# Patient Record
Sex: Male | Born: 1950 | Race: Black or African American | Hispanic: No | Marital: Married | Smoking: Never smoker
Health system: Southern US, Community
[De-identification: ages and names within clinical notes are randomized; demographics above are authoritative.]

## PROBLEM LIST (undated history)

## (undated) HISTORY — PX: OTHER SURGICAL HISTORY: SHX169

## (undated) HISTORY — PX: COLON SURGERY: SHX602

---

## 2010-01-27 ENCOUNTER — Inpatient Hospital Stay (HOSPITAL_COMMUNITY): Admission: EM | Admit: 2010-01-27 | Discharge: 2010-01-29 | Payer: Self-pay | Admitting: Emergency Medicine

## 2010-01-29 ENCOUNTER — Ambulatory Visit: Payer: Self-pay | Admitting: Psychiatry

## 2010-01-29 ENCOUNTER — Inpatient Hospital Stay (HOSPITAL_COMMUNITY): Admission: AD | Admit: 2010-01-29 | Discharge: 2010-01-30 | Payer: Self-pay | Admitting: Psychiatry

## 2010-02-12 ENCOUNTER — Ambulatory Visit: Payer: Self-pay | Admitting: Internal Medicine

## 2010-02-12 DIAGNOSIS — R079 Chest pain, unspecified: Secondary | ICD-10-CM

## 2010-02-12 DIAGNOSIS — J158 Pneumonia due to other specified bacteria: Secondary | ICD-10-CM | POA: Insufficient documentation

## 2010-03-07 ENCOUNTER — Inpatient Hospital Stay (HOSPITAL_COMMUNITY): Admission: EM | Admit: 2010-03-07 | Discharge: 2010-03-09 | Payer: Self-pay | Admitting: Emergency Medicine
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
## 2011-01-14 LAB — BASIC METABOLIC PANEL
BUN: 7 mg/dL (ref 6–23)
Calcium: 8.8 mg/dL (ref 8.4–10.5)
Chloride: 99 mEq/L (ref 96–112)
Creatinine, Ser: 0.77 mg/dL (ref 0.4–1.5)
Creatinine, Ser: 0.77 mg/dL (ref 0.4–1.5)
GFR calc Af Amer: >60 mL/min (ref >60)
GFR calc non Af Amer: >60 mL/min (ref >60)
Potassium: 3.7 mEq/L (ref 3.5–5.1)
Sodium: 135 mEq/L (ref 135–145)

## 2011-01-14 LAB — APTT: aPTT: 28 seconds (ref 24–37)

## 2011-01-14 LAB — CBC
HCT: 44.3 % (ref 39.0–52.0)
MCV: 85.8 fL (ref 78.0–100.0)
MCV: 85.9 fL (ref 78.0–100.0)
MCV: 85.9 fL (ref 78.0–100.0)
Platelets: 190 10*3/uL (ref 150–400)
Platelets: 194 10*3/uL (ref 150–400)
RBC: 4.77 MIL/uL (ref 4.22–5.81)
RBC: 5.15 MIL/uL (ref 4.22–5.81)
RDW: 11.9 % (ref 11.5–15.5)
WBC: 7 10*3/uL (ref 4.0–10.5)
WBC: 7.1 10*3/uL (ref 4.0–10.5)
WBC: 8.8 10*3/uL (ref 4.0–10.5)

## 2011-01-14 LAB — DIFFERENTIAL
Lymphocytes Relative: 7 % — ABNORMAL LOW (ref 12–46)
Lymphs Abs: 0.6 10*3/uL — ABNORMAL LOW (ref 0.7–4.0)
Monocytes Relative: 9 % (ref 3–12)
Neutro Abs: 7.3 10*3/uL (ref 1.7–7.7)
Neutrophils Relative %: 84 % — ABNORMAL HIGH (ref 43–77)

## 2011-01-14 LAB — TYPE AND SCREEN
ABO/RH(D): B POS
Antibody Screen: NEGATIVE

## 2011-01-14 LAB — POCT I-STAT, CHEM 8
Hemoglobin: 16.3 g/dL (ref 13.0–17.0)
Potassium: 3.8 mEq/L (ref 3.5–5.1)
Sodium: 139 mEq/L (ref 135–145)
TCO2: 26 mmol/L (ref 0–100)

## 2011-01-14 LAB — ABO/RH: ABO/RH(D): B POS

## 2011-01-15 LAB — CBC
HCT: 46.2 % (ref 39.0–52.0)
HCT: 49.5 % (ref 39.0–52.0)
Hemoglobin: 14.2 g/dL (ref 13.0–17.0)
Hemoglobin: 15.1 g/dL (ref 13.0–17.0)
MCHC: 33 g/dL (ref 30.0–36.0)
MCV: 88 fL (ref 78.0–100.0)
MCV: 88.4 fL (ref 78.0–100.0)
MCV: 88.6 fL (ref 78.0–100.0)
Platelets: 236 10*3/uL (ref 150–400)
RBC: 4.89 MIL/uL (ref 4.22–5.81)
RBC: 5.6 MIL/uL (ref 4.22–5.81)
RDW: 13 % (ref 11.5–15.5)
WBC: 5.8 10*3/uL (ref 4.0–10.5)
WBC: 8.2 10*3/uL (ref 4.0–10.5)
WBC: 9.4 10*3/uL (ref 4.0–10.5)

## 2011-01-15 LAB — URINALYSIS, ROUTINE W REFLEX MICROSCOPIC
Bilirubin Urine: NEGATIVE
Protein, ur: 30 mg/dL — AB
Urobilinogen, UA: 0.2 mg/dL (ref 0.0–1.0)

## 2011-01-15 LAB — DIFFERENTIAL
Basophils Absolute: 0 10*3/uL (ref 0.0–0.1)
Eosinophils Absolute: 0.2 10*3/uL (ref 0.0–0.7)
Eosinophils Relative: 2 % (ref 0–5)
Lymphocytes Relative: 9 % — ABNORMAL LOW (ref 12–46)
Lymphs Abs: 0.9 10*3/uL (ref 0.7–4.0)
Monocytes Absolute: 0.8 10*3/uL (ref 0.1–1.0)
Monocytes Relative: 6 % (ref 3–12)
Neutro Abs: 6.5 10*3/uL (ref 1.7–7.7)
Neutrophils Relative %: 80 % — ABNORMAL HIGH (ref 43–77)

## 2011-01-15 LAB — CULTURE, BLOOD (ROUTINE X 2): Culture: NO GROWTH

## 2011-01-15 LAB — MAGNESIUM: Magnesium: 1.9 mg/dL (ref 1.5–2.5)

## 2011-01-15 LAB — RAPID URINE DRUG SCREEN, HOSP PERFORMED
Amphetamines: NOT DETECTED
Barbiturates: NOT DETECTED
Benzodiazepines: NOT DETECTED

## 2011-01-15 LAB — EXPECTORATED SPUTUM ASSESSMENT W GRAM STAIN, RFLX TO RESP C

## 2011-01-15 LAB — BASIC METABOLIC PANEL
Chloride: 104 mEq/L (ref 96–112)
GFR calc Af Amer: >60 mL/min (ref >60)
Potassium: 3.6 mEq/L (ref 3.5–5.1)
Sodium: 139 mEq/L (ref 135–145)

## 2011-01-15 LAB — HIV ANTIBODY (ROUTINE TESTING W REFLEX): HIV: NONREACTIVE

## 2011-01-15 LAB — COMPREHENSIVE METABOLIC PANEL
Albumin: 3.3 g/dL — ABNORMAL LOW (ref 3.5–5.2)
BUN: 7 mg/dL (ref 6–23)
Chloride: 102 mEq/L (ref 96–112)
Creatinine, Ser: 0.89 mg/dL (ref 0.4–1.5)
Glucose, Bld: 105 mg/dL — ABNORMAL HIGH (ref 70–99)
Total Bilirubin: 0.9 mg/dL (ref 0.3–1.2)

## 2011-01-15 LAB — URINE MICROSCOPIC-ADD ON

## 2011-01-15 LAB — CULTURE, RESPIRATORY W GRAM STAIN: Culture: NORMAL

## 2011-01-15 LAB — HEPATITIS C ANTIBODY: HCV Ab: NEGATIVE

## 2011-01-15 LAB — ETHANOL: Alcohol, Ethyl (B): 108 mg/dL — ABNORMAL HIGH (ref 0–10)

## 2015-02-27 ENCOUNTER — Emergency Department (HOSPITAL_COMMUNITY): Payer: Self-pay

## 2015-02-27 ENCOUNTER — Encounter (HOSPITAL_COMMUNITY): Payer: Self-pay | Admitting: Emergency Medicine

## 2015-02-27 ENCOUNTER — Emergency Department (HOSPITAL_COMMUNITY)
Admission: EM | Admit: 2015-02-27 | Discharge: 2015-02-27 | Disposition: A | Discharge status: Home / Self Care | Payer: Self-pay | Attending: Emergency Medicine | Admitting: Emergency Medicine

## 2015-02-27 DIAGNOSIS — Y9302 Activity, running: Secondary | ICD-10-CM | POA: Insufficient documentation

## 2015-02-27 DIAGNOSIS — Y929 Unspecified place or not applicable: Secondary | ICD-10-CM | POA: Insufficient documentation

## 2015-02-27 DIAGNOSIS — Z23 Encounter for immunization: Secondary | ICD-10-CM | POA: Insufficient documentation

## 2015-02-27 DIAGNOSIS — S8991XA Unspecified injury of right lower leg, initial encounter: Secondary | ICD-10-CM | POA: Insufficient documentation

## 2015-02-27 DIAGNOSIS — Y998 Other external cause status: Secondary | ICD-10-CM | POA: Insufficient documentation

## 2015-02-27 DIAGNOSIS — Z880 Allergy status to penicillin: Secondary | ICD-10-CM | POA: Insufficient documentation

## 2015-02-27 DIAGNOSIS — W1839XA Other fall on same level, initial encounter: Secondary | ICD-10-CM | POA: Insufficient documentation

## 2015-02-27 DIAGNOSIS — M25561 Pain in right knee: Secondary | ICD-10-CM

## 2015-02-27 DIAGNOSIS — M25461 Effusion, right knee: Secondary | ICD-10-CM

## 2015-02-27 DIAGNOSIS — W19XXXA Unspecified fall, initial encounter: Secondary | ICD-10-CM

## 2015-02-27 MED ORDER — IBUPROFEN 600 MG PO TABS: 600.0000 mg | ORAL_TABLET | Freq: Four times a day (QID) | ORAL | Status: DC | PRN

## 2015-02-27 MED ORDER — HYDROCODONE-ACETAMINOPHEN 5-325 MG PO TABS: 1.0000 | ORAL_TABLET | Freq: Four times a day (QID) | ORAL | Status: AC | PRN

## 2015-02-27 MED ORDER — HYDROCODONE-ACETAMINOPHEN 5-325 MG PO TABS
1.0000 | ORAL_TABLET | Freq: Once | ORAL | Status: AC
  Administered 2015-02-27: 1 via ORAL
  Filled 2015-02-27: qty 1

## 2015-02-27 MED ORDER — TETANUS-DIPHTH-ACELL PERTUSSIS 5-2.5-18.5 LF-MCG/0.5 IM SUSP
0.5000 mL | Freq: Once | INTRAMUSCULAR | Status: AC
  Administered 2015-02-27: 0.5 mL via INTRAMUSCULAR
  Filled 2015-02-27: qty 0.5

## 2015-02-27 NOTE — Discharge Instructions (Signed)
Please follow up with your primary care physician in 1-2 days. If you do not have one please call the [facility] number listed above. Please follow up with Dr. Lajoyce Corners to schedule a follow up appointment.  Please take pain medication and/or muscle relaxants as prescribed and as needed for pain. Please do not drive on narcotic pain medication or on muscle relaxants. Please stay off your knee for 5-7 days and use the knee sleeve to help with swelling. Please read all discharge instructions and return precautions.   Knee Effusion The medical term for having fluid in your knee is effusion. This is often due to an internal derangement of the knee. This means something is wrong inside the knee. Some of the causes of fluid in the knee may be torn cartilage, a torn ligament, or bleeding into the joint from an injury. Your knee is likely more difficult to bend and move. This is often because there is increased pain and pressure in the joint. The time it takes for recovery from a knee effusion depends on different factors, including:   Type of injury.  Your age.  Physical and medical conditions.  Rehabilitation Strategies. How long you will be away from your normal activities will depend on what kind of knee problem you have and how much damage is present. Your knee has two types of cartilage. Articular cartilage covers the bone ends and lets your knee bend and move smoothly. Two menisci, thick pads of cartilage that form a rim inside the joint, help absorb shock and stabilize your knee. Ligaments bind the bones together and support your knee joint. Muscles move the joint, help support your knee, and take stress off the joint itself. CAUSES  Often an effusion in the knee is caused by an injury to one of the menisci. This is often a tear in the cartilage. Recovery after a meniscus injury depends on how much meniscus is damaged and whether you have damaged other knee tissue. Small tears may heal  on their own with conservative treatment. Conservative means rest, limited weight bearing activity and muscle strengthening exercises. Your recovery may take up to 6 weeks.  TREATMENT  Larger tears may require surgery. Meniscus injuries may be treated during arthroscopy. Arthroscopy is a procedure in which your surgeon uses a small telescope like instrument to look in your knee. Your caregiver can make a more accurate diagnosis (learning what is wrong) by performing an arthroscopic procedure. If your injury is on the inner margin of the meniscus, your surgeon may trim the meniscus back to a smooth rim. In other cases your surgeon will try to repair a damaged meniscus with stitches (sutures). This may make rehabilitation take longer, but may provide better long term result by helping your knee keep its shock absorption capabilities. Ligaments which are completely torn usually require surgery for repair. HOME CARE INSTRUCTIONS  Use crutches as instructed.  If a brace is applied, use as directed.  Once you are home, an ice pack applied to your swollen knee may help with discomfort and help decrease swelling.  Keep your knee raised (elevated) when you are not up and around or on crutches.  Only take over-the-counter or prescription medicines for pain, discomfort, or fever as directed by your caregiver.  Your caregivers will help with instructions for rehabilitation of your knee. This often includes strengthening exercises.  You may resume a normal diet and activities as directed. SEEK MEDICAL CARE IF:   There is increased  swelling in your knee.  You notice redness, swelling, or increasing pain in your knee.  An unexplained oral temperature above 102 F (38.9 C) develops. SEEK IMMEDIATE MEDICAL CARE IF:   You develop a rash.  You have difficulty breathing.  You have any allergic reactions from medications you may have been given.  There is severe pain with any motion of the knee. MAKE  SURE YOU:   Understand these instructions.  Will watch your condition.  Will get help right away if you are not doing well or get worse. Document Released: 01/03/2004 Document Revised: 01/05/2012 Document Reviewed: 03/08/2008 [facility] Patient Information 2015 [place], [place]. This information is not intended to replace advice given to you by your health care provider. Make sure you discuss any questions you have with your health care provider. Elastic Bandage and RICE Elastic bandages come in different shapes and sizes. They perform different functions. Your caregiver will help you to decide what is best for your protection, recovery, or rehabilitation following an injury. The following are some general tips to help you use an elastic bandage.  Use the bandage as directed by the maker of the bandage you are using.  Do not wrap it too tight. This may cut off the circulation of the arm or leg below the bandage.  If part of your body beyond the bandage becomes blue, numb, or swollen, it is too tight. Loosen the bandage as needed to prevent these problems.  See your caregiver or trainer if the bandage seems to be making your problems worse rather than better. Bandages may be a reminder to you that you have an injury. However, they provide very little support. The few pounds of support they provide are minor considering the pressure it takes to injure a joint or tear ligaments. Therefore, the joint will not be able to handle all of the wear and tear it could before the injury. The routine care of many injuries includes Rest, Ice, Compression, and Elevation (RICE).  Rest is required to allow your body to heal. Generally, routine activities can be resumed when comfortable. Injured tendons and bones take about 6 weeks to heal.  Icing the injury helps keep the swelling down and reduces pain. Do not apply ice directly to the skin. Put ice in a plastic bag. Place a towel between the skin and the bag.  This will prevent frostbite to the skin. Apply ice bags to the injured area for 15-20 minutes, every 2 hours while awake. Do this for the first 24 to 48 hours, then as directed by your caregiver.  Compression helps keep swelling down, gives support, and helps with discomfort. If an elastic bandage has been applied today, it should be removed and reapplied every 3 to 4 hours. It should not be applied tightly, but firmly enough to keep swelling down. Watch fingers or toes for swelling, bluish discoloration, coldness, numbness, or increased pain. If any of these problems occur, remove the bandage and reapply it more loosely. If these problems persist, contact your caregiver.  Elevation helps reduce swelling and decreases pain. The injured area (arms, hands, legs, or feet) should be placed near to or above the heart (center of the chest) if able. Persistent pain and inability to use the injured area for more than 2 to 3 days are warning signs. You should see a caregiver for a follow-up visit as soon as possible. Initially, a minor broken bone (hairline fracture) may not be seen on X-rays. It may take 7  to 10 days to finally show up. Continued pain and swelling show that further evaluation and/or X-rays are needed. Make a follow-up visit with your caregiver. A specialist in reading X-rays (radiologist) will read your X-rays again. Finding out the results of your test Not all test results are available during your visit. If your test results are not back during the visit, make an appointment with your caregiver to find out the results. Do not assume everything is normal if you have not heard from your caregiver or the medical facility. It is important for you to follow up on all of your test results. Document Released: 04/04/2002 Document Revised: 01/05/2012 Document Reviewed: 02/14/2008 [facility] Patient Information 2015 [place], [place]. This information is not intended to replace advice given to you by your  health care provider. Make sure you discuss any questions you have with your health care provider.
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
## 2015-02-27 NOTE — ED Notes (Signed)
Pt states he fell 2 days ago, injuring right knee. On presentation to ED today, pt had a tight dressing bound around right knee. Knee appears swollen, slightly red and warm to touch. Small abrasions  x 2 noted.

## 2015-08-27 ENCOUNTER — Emergency Department (HOSPITAL_COMMUNITY): Payer: BLUE CROSS/BLUE SHIELD

## 2015-08-27 ENCOUNTER — Observation Stay (HOSPITAL_COMMUNITY)
Admission: EM | Admit: 2015-08-27 | Discharge: 2015-08-28 | Disposition: A | Discharge status: Home / Self Care | Payer: BLUE CROSS/BLUE SHIELD | Attending: Family Medicine | Admitting: Family Medicine

## 2015-08-27 ENCOUNTER — Encounter (HOSPITAL_COMMUNITY): Payer: Self-pay | Admitting: Emergency Medicine

## 2015-08-27 DIAGNOSIS — M79601 Pain in right arm: Secondary | ICD-10-CM | POA: Insufficient documentation

## 2015-08-27 DIAGNOSIS — M542 Cervicalgia: Secondary | ICD-10-CM | POA: Insufficient documentation

## 2015-08-27 DIAGNOSIS — I160 Hypertensive urgency: Secondary | ICD-10-CM | POA: Diagnosis not present

## 2015-08-27 DIAGNOSIS — M549 Dorsalgia, unspecified: Secondary | ICD-10-CM | POA: Insufficient documentation

## 2015-08-27 DIAGNOSIS — R109 Unspecified abdominal pain: Secondary | ICD-10-CM

## 2015-08-27 DIAGNOSIS — M4806 Spinal stenosis, lumbar region: Secondary | ICD-10-CM | POA: Diagnosis not present

## 2015-08-27 DIAGNOSIS — R9431 Abnormal electrocardiogram [ECG] [EKG]: Secondary | ICD-10-CM | POA: Diagnosis not present

## 2015-08-27 DIAGNOSIS — M4317 Spondylolisthesis, lumbosacral region: Secondary | ICD-10-CM | POA: Insufficient documentation

## 2015-08-27 DIAGNOSIS — R200 Anesthesia of skin: Secondary | ICD-10-CM | POA: Diagnosis not present

## 2015-08-27 DIAGNOSIS — Z791 Long term (current) use of non-steroidal anti-inflammatories (NSAID): Secondary | ICD-10-CM | POA: Insufficient documentation

## 2015-08-27 DIAGNOSIS — Z79891 Long term (current) use of opiate analgesic: Secondary | ICD-10-CM | POA: Diagnosis not present

## 2015-08-27 DIAGNOSIS — R208 Other disturbances of skin sensation: Secondary | ICD-10-CM

## 2015-08-27 LAB — CBC
HCT: 45.8 % (ref 39.0–52.0)
Hemoglobin: 15 g/dL (ref 13.0–17.0)
MCH: 29.2 pg (ref 26.0–34.0)
MCHC: 32.8 g/dL (ref 30.0–36.0)
MCV: 89.1 fL (ref 78.0–100.0)
PLATELETS: 188 10*3/uL (ref 150–400)
RBC: 5.14 MIL/uL (ref 4.22–5.81)
RDW: 12.9 % (ref 11.5–15.5)
WBC: 6 10*3/uL (ref 4.0–10.5)

## 2015-08-27 LAB — APTT: aPTT: 33 seconds (ref 24–37)

## 2015-08-27 LAB — BASIC METABOLIC PANEL
Anion gap: 9 (ref 5–15)
BUN: 12 mg/dL (ref 6–20)
CALCIUM: 8.8 mg/dL — AB (ref 8.9–10.3)
CO2: 26 mmol/L (ref 22–32)
Chloride: 102 mmol/L (ref 101–111)
Creatinine, Ser: 0.93 mg/dL (ref 0.61–1.24)
GFR calc non Af Amer: >60 mL/min (ref >60)
GLUCOSE: 137 mg/dL — AB (ref 65–99)
Potassium: 4.1 mmol/L (ref 3.5–5.1)
Sodium: 137 mmol/L (ref 135–145)

## 2015-08-27 LAB — RAPID URINE DRUG SCREEN, HOSP PERFORMED
Amphetamines: NOT DETECTED
BARBITURATES: NOT DETECTED
Benzodiazepines: NOT DETECTED
Cocaine: NOT DETECTED
Opiates: NOT DETECTED
TETRAHYDROCANNABINOL: NOT DETECTED

## 2015-08-27 LAB — VITAMIN B12: Vitamin B-12: 344 pg/mL (ref 180–914)

## 2015-08-27 LAB — PROTIME-INR
INR: 1.14 (ref 0.00–1.49)
PROTHROMBIN TIME: 14.8 s (ref 11.6–15.2)

## 2015-08-27 LAB — TSH: TSH: 4.439 u[IU]/mL (ref 0.350–4.500)

## 2015-08-27 LAB — TROPONIN I

## 2015-08-27 LAB — I-STAT TROPONIN, ED: Troponin i, poc: 0 ng/mL (ref 0.00–0.08)

## 2015-08-27 MED ORDER — ACETAMINOPHEN 650 MG RE SUPP: 650.0000 mg | Freq: Four times a day (QID) | RECTAL | Status: DC | PRN

## 2015-08-27 MED ORDER — ALUM & MAG HYDROXIDE-SIMETH 200-200-20 MG/5ML PO SUSP: 30.0000 mL | Freq: Four times a day (QID) | ORAL | Status: DC | PRN

## 2015-08-27 MED ORDER — ASPIRIN 325 MG PO TABS
325.0000 mg | ORAL_TABLET | Freq: Every day | ORAL | Status: DC
  Administered 2015-08-27 – 2015-08-28 (×2): 325 mg via ORAL
  Filled 2015-08-27 (×2): qty 1

## 2015-08-27 MED ORDER — HEPARIN SODIUM (PORCINE) 5000 UNIT/ML IJ SOLN
5000.0000 [IU] | Freq: Three times a day (TID) | INTRAMUSCULAR | Status: DC
  Administered 2015-08-27: 5000 [IU] via SUBCUTANEOUS
  Filled 2015-08-27: qty 1

## 2015-08-27 MED ORDER — HYDRALAZINE HCL 20 MG/ML IJ SOLN
10.0000 mg | Freq: Once | INTRAMUSCULAR | Status: AC
  Administered 2015-08-27: 10 mg via INTRAVENOUS
  Filled 2015-08-27: qty 1

## 2015-08-27 MED ORDER — HYDRALAZINE HCL 20 MG/ML IJ SOLN: 5.0000 mg | INTRAMUSCULAR | Status: DC | PRN

## 2015-08-27 MED ORDER — NITROGLYCERIN 0.4 MG SL SUBL: 0.4000 mg | SUBLINGUAL_TABLET | SUBLINGUAL | Status: DC | PRN

## 2015-08-27 MED ORDER — ATORVASTATIN CALCIUM 40 MG PO TABS: 40.0000 mg | ORAL_TABLET | Freq: Every day | ORAL | Status: DC

## 2015-08-27 MED ORDER — HYDROCODONE-ACETAMINOPHEN 5-325 MG PO TABS: 1.0000 | ORAL_TABLET | Freq: Four times a day (QID) | ORAL | Status: DC | PRN

## 2015-08-27 MED ORDER — CARVEDILOL 3.125 MG PO TABS: 3.1250 mg | ORAL_TABLET | Freq: Two times a day (BID) | ORAL | Status: DC

## 2015-08-27 MED ORDER — IOHEXOL 350 MG/ML SOLN
100.0000 mL | Freq: Once | INTRAVENOUS | Status: AC | PRN
  Administered 2015-08-27: 100 mL via INTRAVENOUS

## 2015-08-27 MED ORDER — LABETALOL HCL 5 MG/ML IV SOLN
10.0000 mg | Freq: Once | INTRAVENOUS | Status: AC
  Administered 2015-08-27: 10 mg via INTRAVENOUS
  Filled 2015-08-27: qty 4

## 2015-08-27 MED ORDER — SODIUM CHLORIDE 0.9 % IV SOLN: INTRAVENOUS | Status: DC

## 2015-08-27 MED ORDER — MORPHINE SULFATE (PF) 4 MG/ML IV SOLN
4.0000 mg | INTRAVENOUS | Status: DC | PRN
  Administered 2015-08-28: 4 mg via INTRAVENOUS
  Filled 2015-08-27: qty 1

## 2015-08-27 MED ORDER — MORPHINE SULFATE (PF) 4 MG/ML IV SOLN
4.0000 mg | Freq: Once | INTRAVENOUS | Status: AC
  Administered 2015-08-27: 4 mg via INTRAVENOUS
  Filled 2015-08-27: qty 1

## 2015-08-27 MED ORDER — AMLODIPINE BESYLATE 10 MG PO TABS
10.0000 mg | ORAL_TABLET | Freq: Every day | ORAL | Status: DC
  Administered 2015-08-27 – 2015-08-28 (×2): 10 mg via ORAL
  Filled 2015-08-27: qty 2
  Filled 2015-08-27: qty 1

## 2015-08-27 MED ORDER — SODIUM CHLORIDE 0.9 % IJ SOLN
3.0000 mL | Freq: Two times a day (BID) | INTRAMUSCULAR | Status: DC
  Administered 2015-08-28: 3 mL via INTRAVENOUS

## 2015-08-27 MED ORDER — ACETAMINOPHEN 325 MG PO TABS: 650.0000 mg | ORAL_TABLET | Freq: Four times a day (QID) | ORAL | Status: DC | PRN

## 2015-08-27 NOTE — ED Notes (Signed)
Pt reports Saturday he started to have pain in R side of neck. Since then the pain has progressively radiated down R arm. Reports numbness to R thumb. Grip strengths equal. denies cp.

## 2015-08-27 NOTE — ED Notes (Signed)
Dr Thurnell LoseYow in room and asked for new EKG. New EKG shot by this RN. Pt has PAC's.
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
## 2015-08-28 ENCOUNTER — Inpatient Hospital Stay (HOSPITAL_COMMUNITY): Payer: BLUE CROSS/BLUE SHIELD

## 2015-08-28 DIAGNOSIS — I160 Hypertensive urgency: Secondary | ICD-10-CM

## 2015-08-28 DIAGNOSIS — M542 Cervicalgia: Secondary | ICD-10-CM | POA: Diagnosis not present

## 2015-08-28 DIAGNOSIS — M79601 Pain in right arm: Secondary | ICD-10-CM | POA: Diagnosis not present

## 2015-08-28 DIAGNOSIS — M549 Dorsalgia, unspecified: Secondary | ICD-10-CM | POA: Diagnosis not present

## 2015-08-28 DIAGNOSIS — R9431 Abnormal electrocardiogram [ECG] [EKG]: Secondary | ICD-10-CM

## 2015-08-28 LAB — CBC
HEMATOCRIT: 44.1 % (ref 39.0–52.0)
HEMOGLOBIN: 14.3 g/dL (ref 13.0–17.0)
MCH: 28.7 pg (ref 26.0–34.0)
MCHC: 32.4 g/dL (ref 30.0–36.0)
MCV: 88.4 fL (ref 78.0–100.0)
PLATELETS: 189 10*3/uL (ref 150–400)
RBC: 4.99 MIL/uL (ref 4.22–5.81)
RDW: 12.9 % (ref 11.5–15.5)
WBC: 6 10*3/uL (ref 4.0–10.5)

## 2015-08-28 LAB — COMPREHENSIVE METABOLIC PANEL
ALT: 28 U/L (ref 17–63)
ANION GAP: 6 (ref 5–15)
AST: 31 U/L (ref 15–41)
Albumin: 3.5 g/dL (ref 3.5–5.0)
Alkaline Phosphatase: 53 U/L (ref 38–126)
BUN: 9 mg/dL (ref 6–20)
CHLORIDE: 105 mmol/L (ref 101–111)
CO2: 27 mmol/L (ref 22–32)
CREATININE: 0.75 mg/dL (ref 0.61–1.24)
Calcium: 8.8 mg/dL — ABNORMAL LOW (ref 8.9–10.3)
Glucose, Bld: 112 mg/dL — ABNORMAL HIGH (ref 65–99)
POTASSIUM: 3.6 mmol/L (ref 3.5–5.1)
SODIUM: 138 mmol/L (ref 135–145)
Total Bilirubin: 1 mg/dL (ref 0.3–1.2)
Total Protein: 7.3 g/dL (ref 6.5–8.1)

## 2015-08-28 LAB — TROPONIN I

## 2015-08-28 LAB — LIPID PANEL
CHOL/HDL RATIO: 4.1 ratio
Cholesterol: 164 mg/dL (ref 0–200)
HDL: 40 mg/dL — AB (ref >40)
LDL Cholesterol: 110 mg/dL — ABNORMAL HIGH (ref 0–99)
Triglycerides: 71 mg/dL (ref <150)
VLDL: 14 mg/dL (ref 0–40)

## 2015-08-28 LAB — GLUCOSE, CAPILLARY: GLUCOSE-CAPILLARY: 91 mg/dL (ref 65–99)

## 2015-08-28 MED ORDER — AMLODIPINE BESYLATE 10 MG PO TABS: 10.0000 mg | ORAL_TABLET | Freq: Every day | ORAL | Status: AC

## 2015-08-28 MED ORDER — ATORVASTATIN CALCIUM 40 MG PO TABS: 40.0000 mg | ORAL_TABLET | Freq: Every day | ORAL | Status: AC

## 2015-08-28 MED ORDER — ASPIRIN 81 MG PO TABS: 81.0000 mg | ORAL_TABLET | Freq: Every day | ORAL | Status: AC

## 2015-08-28 NOTE — Progress Notes (Signed)
Discharge teaching and instructions reviewed with pt.VSS Pt discharging home with wife.

## 2015-08-28 NOTE — Care Management Note (Addendum)
Case Management Note  Patient Details  Name: Joseph Sanchez MRN: [id number] Date of Birth: 1951/10/20  Subjective/Objective:  Pt admitted for hypertension urgency.                   Action/Plan: CM will continue to monitor. Pt is without PCP and insurance. Will f/u.    Expected Discharge Date:                  Expected Discharge Plan:  Home/Self Care  In-House Referral:     Discharge planning Services  CM Consult  Post Acute Care Choice:    Choice offered to:     DME Arranged:    DME Agency:     HH Arranged:    HH Agency:     Status of Service:  In process, will continue to follow  Medicare Important Message Given:    Date Medicare IM Given:    Medicare IM give by:    Date Additional Medicare IM Given:    Additional Medicare Important Message give by:     If discussed at Long Length of Stay Meetings, dates discussed:    Additional Comments: 1350 08-28-15 Joseph BambergerBrenda Graves-Bigelow, RN,BSN [phone] CM was able to speak with pt in regards to PCP before d/c. [facility] Pt will make hospital f/u. No further needs from CM at this time.   Joseph Sanchez, Joseph Harju Kaye, RN 08/28/2015, 12:39 PM

## 2015-08-28 NOTE — Discharge Summary (Signed)
Physician Discharge Summary  Joseph Sanchez ZOX:[id number] DOB: 07-Jun-1951 DOA: 08/27/2015  PCP: No PCP Per Patient  Admit date: 08/27/2015 Discharge date: 08/28/2015  Time spent: > 30 minutes  Recommendations for Outpatient Follow-up:  1. Please monitor blood pressures and adjust antihypertensive regimen accordingly  Discharge Diagnoses:  Principal Problem:   Hypertensive urgency Active Problems:   Abnormal EKG   Neck pain   Discharge Condition: stable  Diet recommendation: heart healthy/low sodium diet  Filed Weights   08/27/15 1750 08/27/15 2214  Weight: 92.897 kg (204 lb 12.8 oz) 90.992 kg (200 lb 9.6 oz)    History of present illness:  There is a 64 year old who presented complaining of numbness in the right, neck pain. He denies any chest pain and was initially found to have elevated blood pressures a 191/94 reportedly in the ED  Hospital Course:  Hypertension - Was initially uncontrolled but improved controlled on amlodipine. Will provide prescription on discharge. Will also recommend low sodium diet.  Right thumb numbness - MRI of brain reported no acute intracranial infarct -MRI of cervical spine reported severe right foraminal narrowing at C5-C6. Case discussed with neurosurgery who recommends NSAIDs, opioids, and follow-up with neurosurgery in 3-4 weeks - Patient denies any pain in the thumb or numbness on day of discharge  Inverted T waves in EKG - Echocardiogram reported no regional wall motion abnormalities and normal EF of 65-70%. He denies any chest pain and does not endorse any chest pain to me since visit to the hospital. -Troponin negative 3 - Patient to follow-up with cardiologist. Case discussed with cardiologist over the phone and patient okay to follow-up as outpatient since he is having no chest discomfort  Procedures:  None  Consultations:  Cardiology  Neurosurgery  Discharge Exam: Filed Vitals:   08/28/15 0924  BP: 155/85  Pulse: 69   Temp: 99.1 F (37.3 C)  Resp:     General: Patient in no acute distress, alert and awake Cardiovascular: Regular rate and rhythm, no rubs Respiratory: No increased work of breathing, no wheezes, equal chest rise  Discharge Instructions   Discharge Instructions    Call MD for:  temperature >100.4    Complete by:  As directed      Diet - low sodium heart healthy    Complete by:  As directed      Discharge instructions    Complete by:  As directed   Please follow up with the Cardiologist for further evaluation and recommendations.  Also follow up with the neurosurgeon     Increase activity slowly    Complete by:  As directed           Current Discharge Medication List    START taking these medications   Details  amLODipine (NORVASC) 10 MG tablet Take 1 tablet (10 mg total) by mouth daily. Qty: 30 tablet, Refills: 0    aspirin 81 MG tablet Take 1 tablet (81 mg total) by mouth daily. Qty: 30 tablet, Refills: 0    atorvastatin (LIPITOR) 40 MG tablet Take 1 tablet (40 mg total) by mouth daily at 6 PM. Qty: 30 tablet, Refills: 0      CONTINUE these medications which have NOT CHANGED   Details  ibuprofen (ADVIL,MOTRIN) 200 MG tablet Take 400 mg by mouth every 6 (six) hours as needed for moderate pain.    HYDROcodone-acetaminophen (NORCO/VICODIN) 5-325 MG per tablet Take 1-2 tablets by mouth every 6 (six) hours as needed for severe pain. Qty: 15 tablet, Refills: 0  Allergies  Allergen Reactions  . Penicillins Swelling and Other (See Comments)    Lip swelling and peeling  . Tetracyclines & Related Swelling and Other (See Comments)    LIP SWELLING AND PEELING   Follow-up Information    Follow up with NUNDKUMAR, NEELESH, C, MD In 4 weeks.   Specialty:  Neurosurgery   Contact information:   [address] [place] [address] [phone]        The results of significant diagnostics from this hospitalization (including imaging,  microbiology, ancillary and laboratory) are listed below for reference.    Significant Diagnostic Studies: Dg Chest 2 View  08/27/2015  CLINICAL DATA:  Chest pain, right neck pain, right arm pain and right back pain, beginning 2 days ago. Right thumb numbness. EXAM: CHEST  2 VIEW COMPARISON:  03/07/2010. FINDINGS: Normal sized heart. The previously seen bilateral airspace opacities have resolved. Small amount of linear density in those regions today. Mildly prominent interstitial markings with flattening of the hemidiaphragms. Thoracic spine degenerative changes, including changes of DISH. Mild bilateral shoulder degenerative changes. IMPRESSION: 1. No acute abnormality. 2. Mild changes of COPD. 3. Mild bilateral scarring. Electronically Signed   By: Beckie Salts M.D.   On: 08/27/2015 18:56   Mr Brain Wo Contrast  08/28/2015  CLINICAL DATA:  Initial evaluation for right-sided facial and neck pain. EXAM: MRI HEAD WITHOUT CONTRAST TECHNIQUE: Multiplanar, multiecho pulse sequences of the brain and surrounding structures were obtained without intravenous contrast. COMPARISON:  None. FINDINGS: Mild age-related cerebral atrophy present. Patchy T2/FLAIR hyperintensity within the periventricular and deep white matter both cerebral hemispheres most likely related to chronic small vessel ischemic disease, moderate nature. Small remote lacunar infarct within the anterior aspect of the left centrum semi ovale. No other areas of chronic infarction. No abnormal foci of restricted diffusion to suggest acute intracranial infarct. Gray-white matter differentiation is maintained. Normal intravascular flow voids are preserved. No acute or chronic intracranial hemorrhage. No mass lesion, midline shift, or mass effect. No hydrocephalus. No extra-axial fluid collection. Craniocervical junction within normal limits. Pituitary gland normal.  No acute abnormality about the orbits. Scattered mucosal thickening within the ethmoidal  air cells. Paranasal sinuses are otherwise clear. No mastoid effusion. Inner ear structures grossly unremarkable. Bone marrow signal intensity within normal limits. No acute scalp soft tissue abnormality. IMPRESSION: 1. No acute intracranial infarct or other process identified. 2. Mild age-related cerebral atrophy with moderate chronic small vessel ischemic disease. Electronically Signed   By: Rise Mu M.D.   On: 08/28/2015 04:47   Mr Cervical Spine Wo Contrast  08/28/2015  CLINICAL DATA:  Initial evaluation for acute right-sided facial pain and neck pain radiating into right upper extremity. EXAM: MRI CERVICAL SPINE WITHOUT CONTRAST TECHNIQUE: Multiplanar, multisequence MR imaging of the cervical spine was performed. No intravenous contrast was administered. COMPARISON:  None. FINDINGS: Visualized portions of the brain and posterior fossa demonstrate a normal appearance with normal signal intensity. The craniocervical junction is widely patent. Minimal trace anterolisthesis of C7 on T1. Vertebral bodies are otherwise normally aligned with preservation of the normal cervical lordosis paravertebral body heights are well preserved. No fracture or listhesis. Signal intensity within the vertebral body bone marrow is normal. No focal osseous lesions. No marrow edema. Signal intensity within the cervical spinal cord is normal. Incidental note made of a a well-circumscribed T2 hyperintense ovoid 14 mm lesion within the skin of the right face (series 17, image 10), indeterminate, but of doubtful clinical significance. Visualized  soft tissues otherwise unremarkable. No prevertebral edema. Apparent susceptibility artifact partially visualized within the left upper neck seen on axial sequence favored to be artifactual in nature (series 16, image 1). C2-C3: Minimal bilateral uncovertebral hypertrophy without significant stenosis. C3-C4: Bilateral uncovertebral hypertrophy with mild right-sided facet arthrosis.  There is a superimposed more focal right foraminal disc osteophyte complex resulting an moderate to severe right foraminal stenosis. Left-sided uncovertebral spurring and facet arthrosis results in mild to moderate left foraminal narrowing as well. Minimal flattening of the posterior thecal sac without significant canal stenosis. C4-C5: Bilateral uncovertebral hypertrophy with mild facet arthrosis. Superimposed right foraminal disc osteophyte complex results in fairly severe right foraminal stenosis. Mild to moderate left foraminal narrowing related uncovertebral hypertrophy and facet disease. No significant canal stenosis. C5-C6: Bilateral uncovertebral hypertrophy with mild bilateral facet arthrosis. Superimposed more focal broad-based right foraminal disc osteophyte complex with resultant moderate to severe right foraminal stenosis. Degenerative changes results in mild moderate left foraminal narrowing as well. Posterior disc osteophyte minimally indents and effaces the ventral thecal sac with resultant very mild canal stenosis. Superimposed mild ligamentum flavum hypertrophy. C6-C7: Mild bilateral uncovertebral hypertrophy and facet arthrosis. Broad-based annular disc bulge. Resultant mild to moderate bilateral foraminal stenosis. No significant canal narrowing. C7-T1:  Trace anterolisthesis C7 on T1.  No significant stenosis. Visualized upper thoracic spine demonstrates no acute abnormality or stenosis. IMPRESSION: 1. Right foraminal degenerative disc osteophyte complexes at C3-4, C4-5, and C5-6 with superimposed facet disease, resulting in moderate to severe right foraminal narrowing at these levels. Changes could certainly result in patient's right-sided symptoms. 2. Additional more mild multilevel degenerative spondylolysis as detailed above. Mild canal narrowing at C5-6. No other significant canal stenosis within the cervical spine. Electronically Signed   By: Rise MuBenjamin  McClintock M.D.   On: 08/28/2015  05:05   Ct Angio Chest Aorta W/cm &/or Wo/cm  08/27/2015  CLINICAL DATA:  Chest and upper abdominal pain radiating to the back, asymmetric to right accompanied by shortness of breath starting 2 days ago. EXAM: CT ANGIOGRAPHY CHEST AND ABDOMEN TECHNIQUE: Multidetector CT imaging of the chest and abdomen was performed using the standard protocol during bolus administration of intravenous contrast. Multiplanar CT image reconstructions and MIPs were obtained to evaluate the vascular anatomy. CONTRAST:  100mL OMNIPAQUE IOHEXOL 350 MG/ML SOLN COMPARISON:  CT scan of the chest dated 01/27/2010 and chest x-ray dated 08/27/2015 FINDINGS: CTA CHEST FINDINGS There is no evidence of aortic dissection. Heart size is normal. No coronary artery calcification. Minimal calcification in the arch of the aorta. Origins of the brachiocephalic vessels are normal. There are no acute pulmonary infiltrates or effusions. No adenopathy. There is a stable 2.7 cm bleb in the left lower lobe. There is peripheral scarring at both lung bases and laterally in the mid zones with multiple tiny pleural-based nodules bilaterally which are unchanged since the prior exam. No pulmonary emboli. No significant osseous abnormality. Degenerative osteophytes throughout the thoracic spine. Review of the MIP images confirms the above findings. CTA ABDOMEN FINDINGS The abdominal aorta is normal. Widely patent SMA, IMA and celiac arteries. Widely patent renal arteries, 2 on the left and 1 on the right. Minimal calcification in the distal abdominal aorta. Liver, spleen, adrenal glands and kidneys are normal except for a stable 14 mm cyst in the lower pole the right kidney. Pancreas is normal. Chronic slight prominence of the pancreatic duct is not felt to be significant. Biliary tree is normal. The bowel appears normal including the terminal ileum and appendix.  Bladder and prostate gland are normal. No adenopathy. Severe facet arthritis at L5-S1 with grade 1  spondylolisthesis. Prominent soft disc protrusions into both neural foramina which could affect either or both L5 nerves. Moderate bilateral foraminal stenosis at L4-5. Review of the MIP images confirms the above findings. IMPRESSION: 1. No evidence of aortic dissection or other acute abnormality in the chest. Chronic peripheral scarring in both lungs. 2. No acute abnormality of the abdomen or pelvis. Electronically Signed   By: Francene Boyers M.D.   On: 08/27/2015 20:09   Ct Angio Abd/pel W/ And/or W/o  08/27/2015  CLINICAL DATA:  Chest and upper abdominal pain radiating to the back, asymmetric to right accompanied by shortness of breath starting 2 days ago. EXAM: CT ANGIOGRAPHY CHEST AND ABDOMEN TECHNIQUE: Multidetector CT imaging of the chest and abdomen was performed using the standard protocol during bolus administration of intravenous contrast. Multiplanar CT image reconstructions and MIPs were obtained to evaluate the vascular anatomy. CONTRAST:  OMNIPAQUE IOHEXOL 350 MG/ML SOLN COMPARISON:  CT scan of the chest dated 01/27/2010 and chest x-ray dated 08/27/2015 FINDINGS: CTA CHEST FINDINGS There is no evidence of aortic dissection. Heart size is normal. No coronary artery calcification. Minimal calcification in the arch of the aorta. Origins of the brachiocephalic vessels are normal. There are no acute pulmonary infiltrates or effusions. No adenopathy. There is a stable 2.7 cm bleb in the left lower lobe. There is peripheral scarring at both lung bases and laterally in the mid zones with multiple tiny pleural-based nodules bilaterally which are unchanged since the prior exam. No pulmonary emboli. No significant osseous abnormality. Degenerative osteophytes throughout the thoracic spine. Review of the MIP images confirms the above findings. CTA ABDOMEN FINDINGS The abdominal aorta is normal. Widely patent SMA, IMA and celiac arteries. Widely patent renal arteries, 2 on the left and 1 on the right.  Minimal calcification in the distal abdominal aorta. Liver, spleen, adrenal glands and kidneys are normal except for a stable 14 mm cyst in the lower pole the right kidney. Pancreas is normal. Chronic slight prominence of the pancreatic duct is not felt to be significant. Biliary tree is normal. The bowel appears normal including the terminal ileum and appendix. Bladder and prostate gland are normal. No adenopathy. 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 Prominent soft disc protrusions into both neural foramina which could affect either or both L5 nerves. Moderate bilateral foraminal stenosis at L4-5. Review of the MIP images confirms the above findings. IMPRESSION: 1. No evidence of aortic dissection or other acute abnormality in the chest. Chronic peripheral scarring in both lungs. 2. No acute abnormality of the abdomen or pelvis. Electronically Signed   By: Francene Boyers M.D.   On: 08/27/2015 20:09    Microbiology: No results found for this or any previous visit (from the past 240 hour(s)).   Labs: Basic Metabolic Panel:  Recent Labs Lab 08/27/15 1809 08/28/15 0406  NA 137 138  K 4.1 3.6  CL 102 105  CO2 26 27  GLUCOSE 137* 112*  BUN 12 9  CREATININE 0.93 0.75  CALCIUM 8.8* 8.8*   Liver Function Tests:  Recent Labs Lab 08/28/15 0406  AST 31  ALT 28  ALKPHOS 53  BILITOT 1.0  PROT 7.3  ALBUMIN 3.5   No results for input(s): LIPASE, AMYLASE in the last 168 hours. No results for input(s): AMMONIA in the last 168 hours. CBC:  Recent Labs Lab 08/27/15 1809 08/28/15 0406  WBC  6.0 6.0  HGB 15.0 14.3  HCT 45.8 44.1  MCV 89.1 88.4  PLT 188 189   Cardiac Enzymes:  Recent Labs Lab 08/27/15 2209 08/28/15 0406 08/28/15 0957  TROPONINI <0.03 <0.03 <0.03   BNP: BNP (last 3 results) No results for input(s): BNP in the last 8760 hours.  ProBNP (last 3 results) No results for input(s): PROBNP in the last 8760 hours.  CBG:  Recent Labs Lab  08/28/15 0739  GLUCAP 91       Signed:  Penny Pia  [facility] 08/28/2015, 1:13 PM

## 2015-08-28 NOTE — Progress Notes (Signed)
I have reviewed the patient's history and MRI Cspine. He has had acute onset of right-sided neck pain. MRI demonstrates mod-severe multilevel foraminal stenosis from C3-4 to C5-6, without central stenosis or spinal cord compression. I would initially manage symptoms medically with anti-inflammatories, perhaps PRN opiates. I can see him in the office in 4-6 weeks for follow-up at which time we can assess the need for more invasive management options (ESI or surgery).

## 2015-08-28 NOTE — Progress Notes (Signed)
  Echocardiogram 2D Echocardiogram has been performed.  Joseph BasemanReel, [name] M 08/28/2015, 9:36 AM
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
## 2015-08-28 NOTE — Progress Notes (Signed)
UR Completed Kraven Calk Graves-Bigelow, RN,BSN [phone]  

## 2015-08-29 LAB — HEMOGLOBIN A1C
Hgb A1c MFr Bld: 5.3 % (ref 4.8–5.6)
MEAN PLASMA GLUCOSE: 105 mg/dL

## 2016-07-27 IMAGING — MR MR HEAD W/O CM
8 of 10 series · 35 of 48 positions shown · non-contrast
Comparison: None.

CLINICAL DATA: Initial evaluation for right-sided facial and neck
pain.

EXAM:
MRI HEAD WITHOUT CONTRAST
TECHNIQUE: Multiplanar, multiecho pulse sequences of the brain and surrounding
structures were obtained without intravenous contrast.

[Series 3: DWI · axial · 3.0mm · 1.09mm/px · z∈[-95,+27]mm · 8 of 84 slices shown (1 of 4)]
[im 1/84]
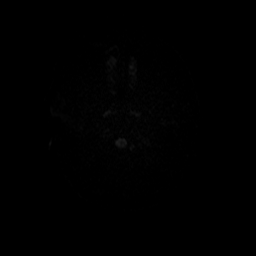
[im 12/84]
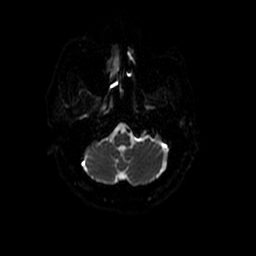
[im 24/84]
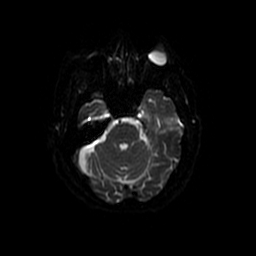
[im 36/84]
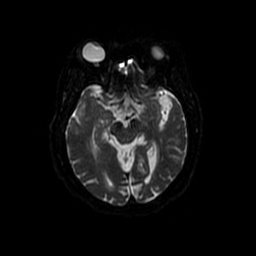
[im 48/84]
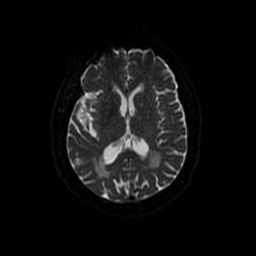
[im 60/84]
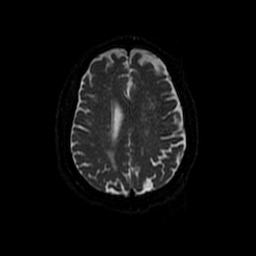
[im 72/84]
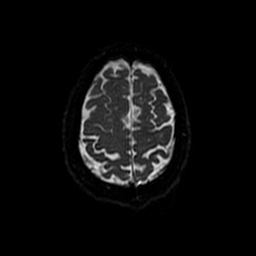
[im 84/84]
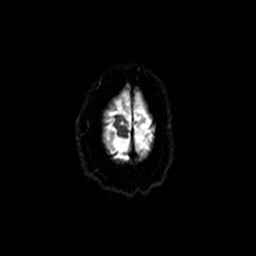

[Series 4: T1 · sagittal · 5.0mm · 0.47mm/px · 2 of 24 slices shown]
[im 1/24]
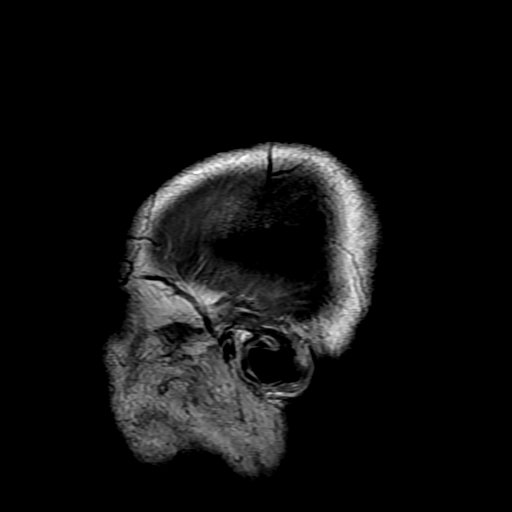
[im 24/24]
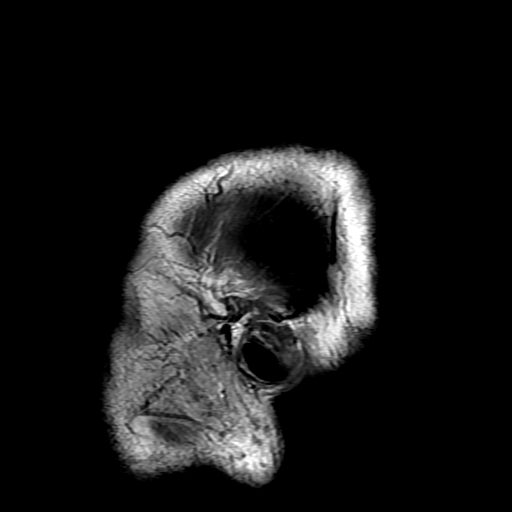

[Series 5: DWI · coronal · 5.0mm · 1.09mm/px · 7 of 66 slices shown (2 of 4)]
[im 1/66]
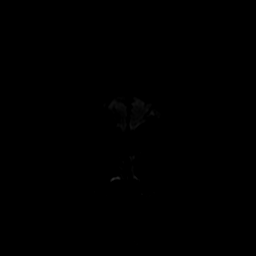
[im 11/66]
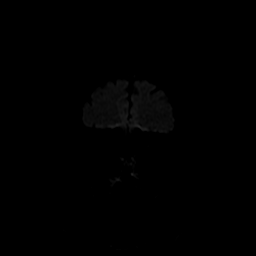
[im 22/66]
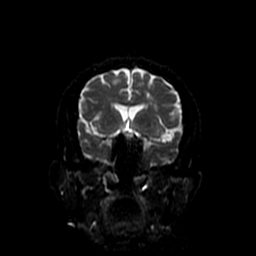
[im 33/66]
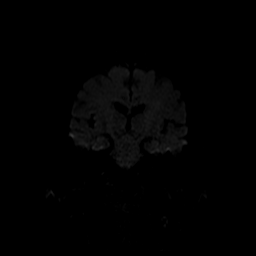
[im 44/66]
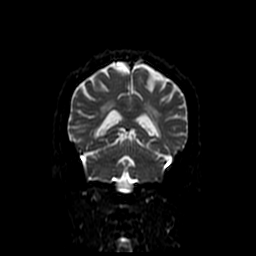
[im 55/66]
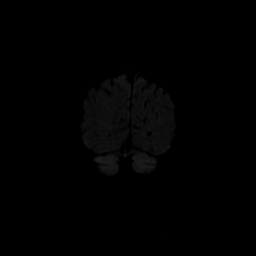
[im 66/66]
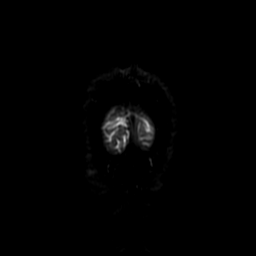

[Series 6: T2 · axial · 5.0mm · 0.43mm/px · z∈[-90,+46]mm · 3 of 24 slices shown (1 of 2)]
[im 1/24]
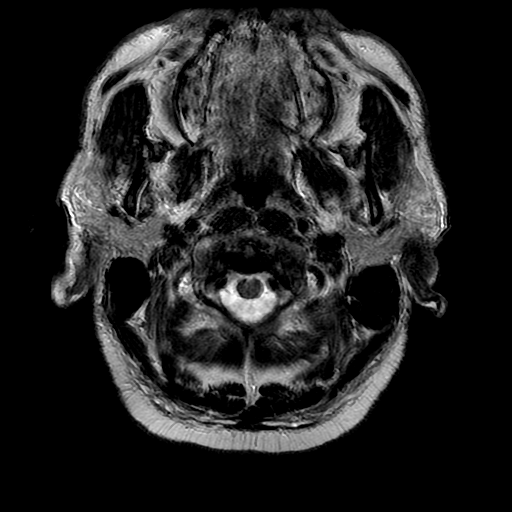
[im 12/24]
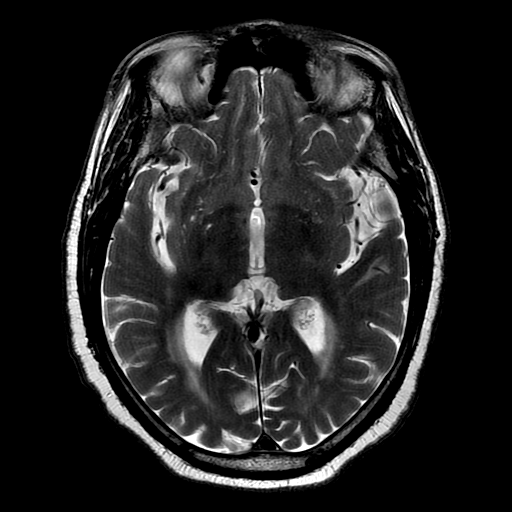
[im 24/24]
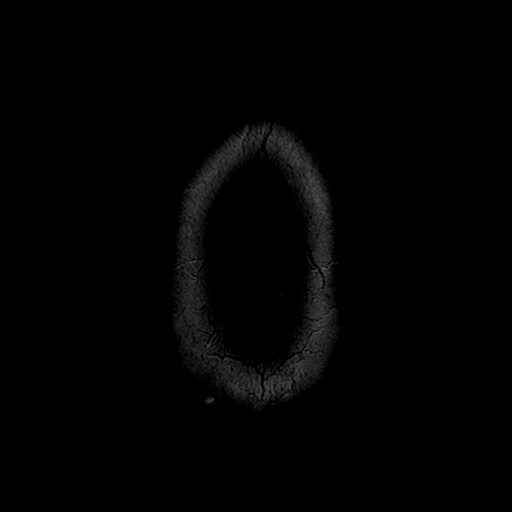

[Series 7: FLAIR · axial · 5.0mm · 0.43mm/px · z∈[-90,+46]mm · 3 of 24 slices shown]
[im 1/24]
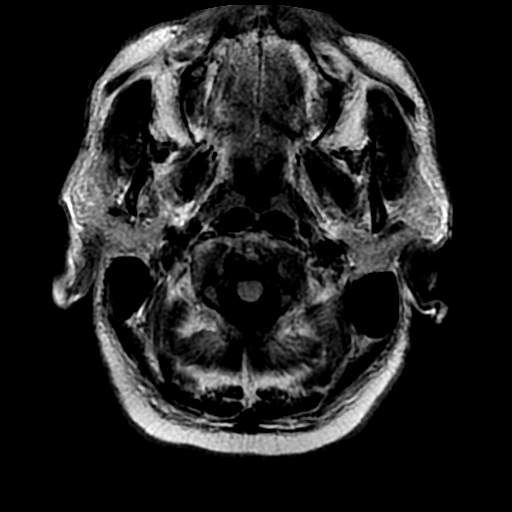
[im 12/24]
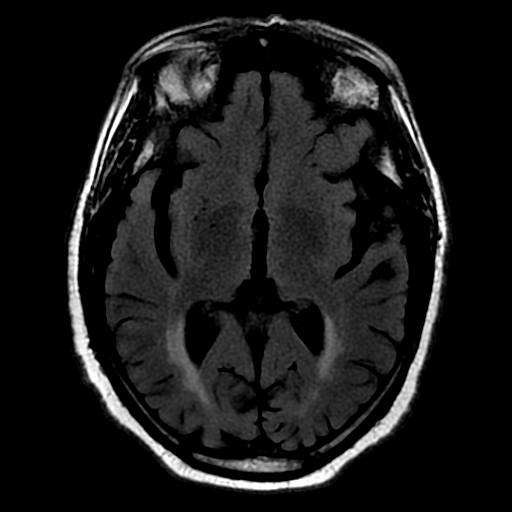
[im 24/24]
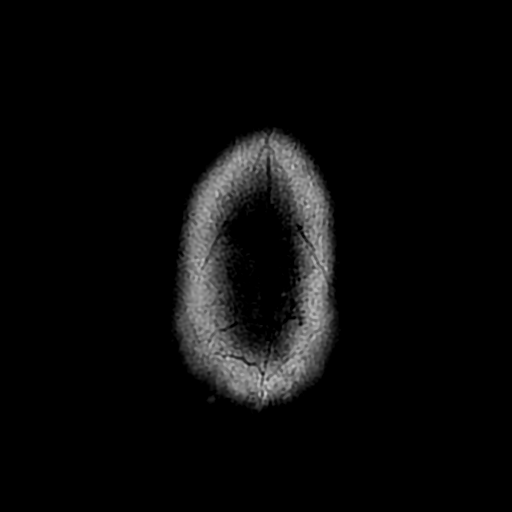

[Series 10: T2 · coronal · 5.0mm · 0.43mm/px · 3 of 30 slices shown (2 of 2)]
[im 1/30]
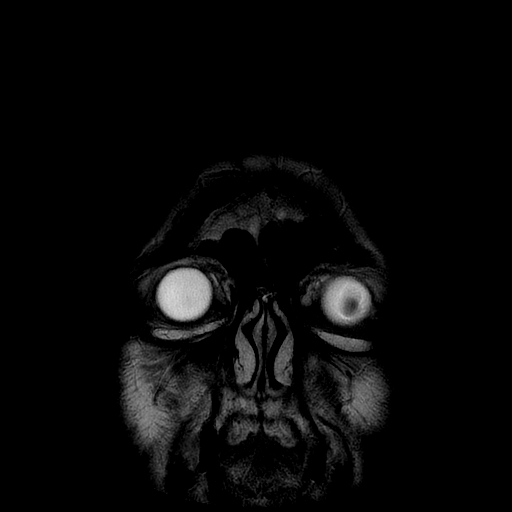
[im 15/30]
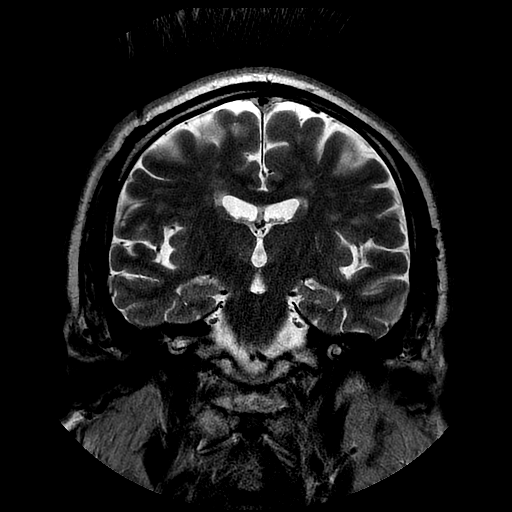
[im 30/30]
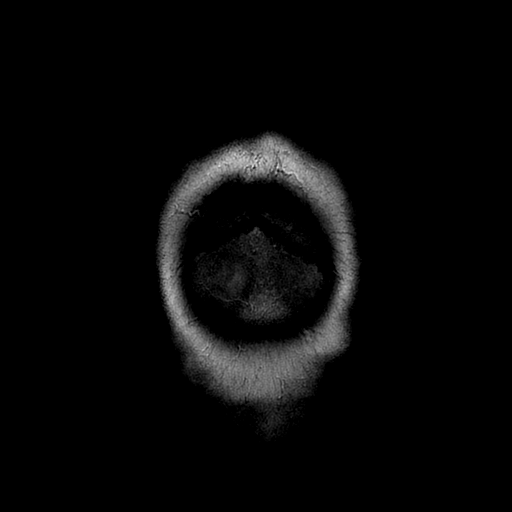

[Series 300: DWI · axial · 3.0mm · 1.09mm/px · z∈[-95,+27]mm · 5 of 42 slices shown (3 of 4)]
[im 1/42]
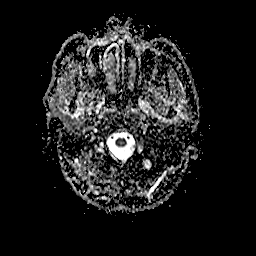
[im 11/42]
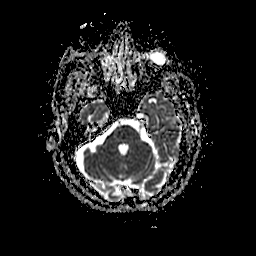
[im 21/42]
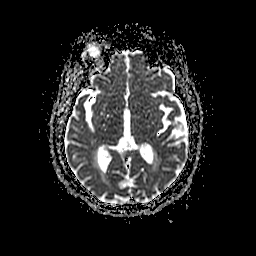
[im 31/42]
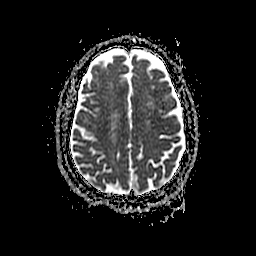
[im 42/42]
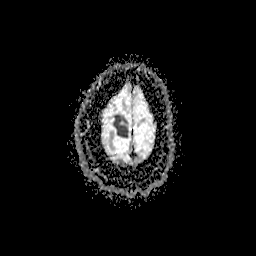

[Series 500: DWI · coronal · 5.0mm · 1.09mm/px · 4 of 33 slices shown (4 of 4)]
[im 1/33]
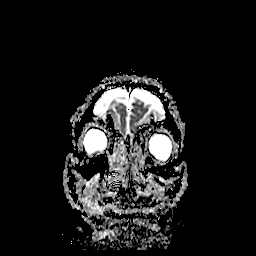
[im 11/33]
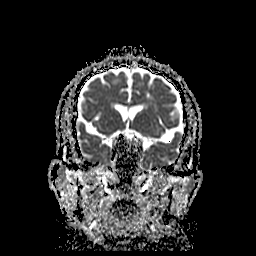
[im 22/33]
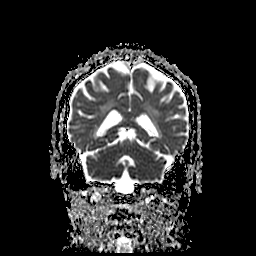
[im 33/33]
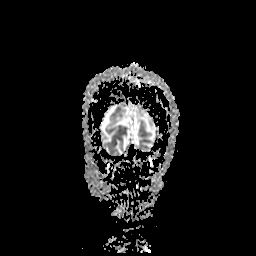

[35 of 48 positions shown; findings below may reference images not displayed]

FINDINGS: Mild age-related cerebral atrophy present. Patchy T2/FLAIR
hyperintensity within the periventricular and deep white matter both
cerebral hemispheres most likely related to chronic small vessel
ischemic disease, moderate nature. Small remote lacunar infarct
within the anterior aspect of the left centrum semi ovale. No other
areas of chronic infarction.

No abnormal foci of restricted diffusion to suggest acute
intracranial infarct. Gray-white matter differentiation is
maintained. Normal intravascular flow voids are preserved. No acute
or chronic intracranial hemorrhage.

No mass lesion, midline shift, or mass effect. No hydrocephalus. No
extra-axial fluid collection.

Craniocervical junction within normal limits.

Pituitary gland normal.  No acute abnormality about the orbits.

Scattered mucosal thickening within the ethmoidal air cells.
Paranasal sinuses are otherwise clear. No mastoid effusion. Inner
ear structures grossly unremarkable.

Bone marrow signal intensity within normal limits. No acute scalp
soft tissue abnormality.
IMPRESSION: 1. No acute intracranial infarct or other process identified.
2. Mild age-related cerebral atrophy with moderate chronic small
vessel ischemic disease.
